# Patient Record
Sex: Male | Born: 1950
Health system: Southern US, Community
[De-identification: ages and names within clinical notes are randomized; demographics above are authoritative.]

## PROBLEM LIST (undated history)

## (undated) DIAGNOSIS — I10 Essential (primary) hypertension: Secondary | ICD-10-CM

---

## 2007-07-25 ENCOUNTER — Emergency Department (HOSPITAL_COMMUNITY): Admission: EM | Admit: 2007-07-25 | Discharge: 2007-07-25 | Payer: Self-pay | Admitting: Emergency Medicine

## 2007-07-28 ENCOUNTER — Ambulatory Visit (HOSPITAL_COMMUNITY): Admission: RE | Admit: 2007-07-28 | Discharge: 2007-07-29 | Payer: Self-pay | Admitting: Orthopedic Surgery

## 2009-06-25 IMAGING — CR DG CHEST 2V
2 series · 2 of 2 positions shown · non-contrast
Comparison: None

CLINICAL DATA: Preop left wrist fracture

CHEST - 2 VIEW

[view not recorded (1 of 2)]
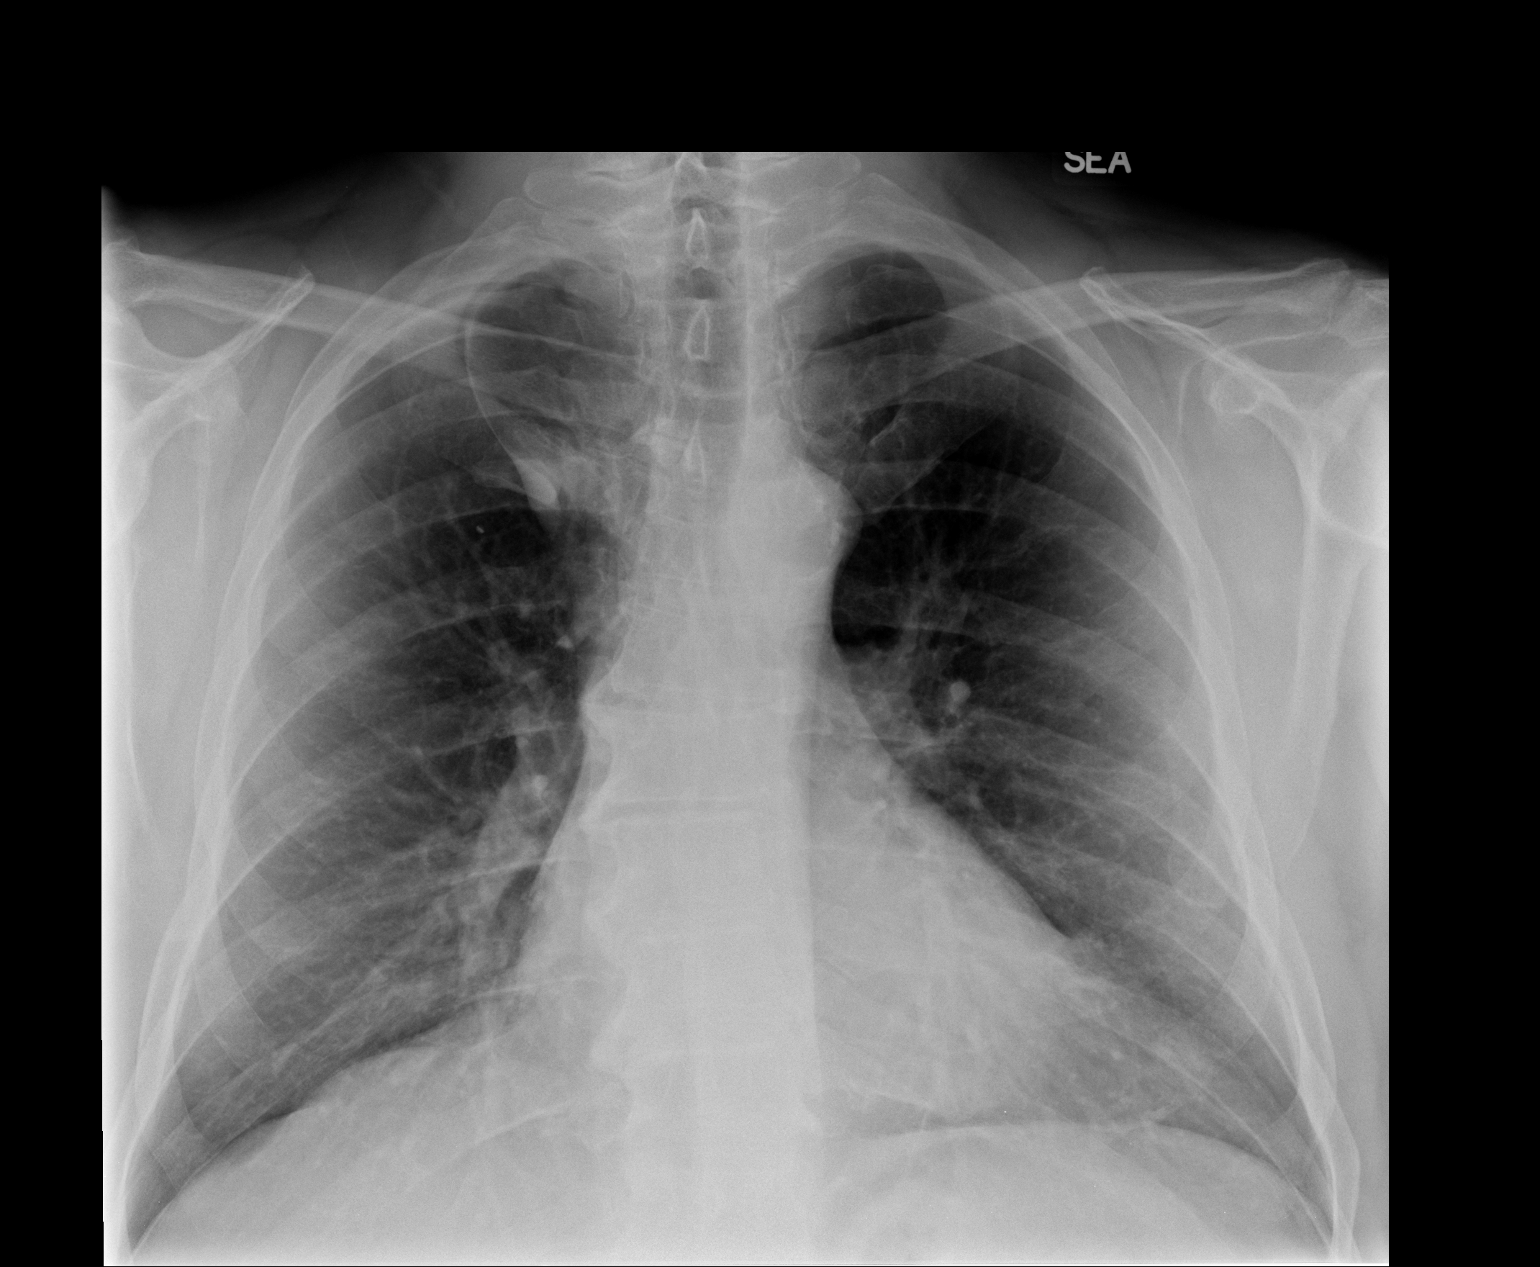

[view not recorded (2 of 2)]
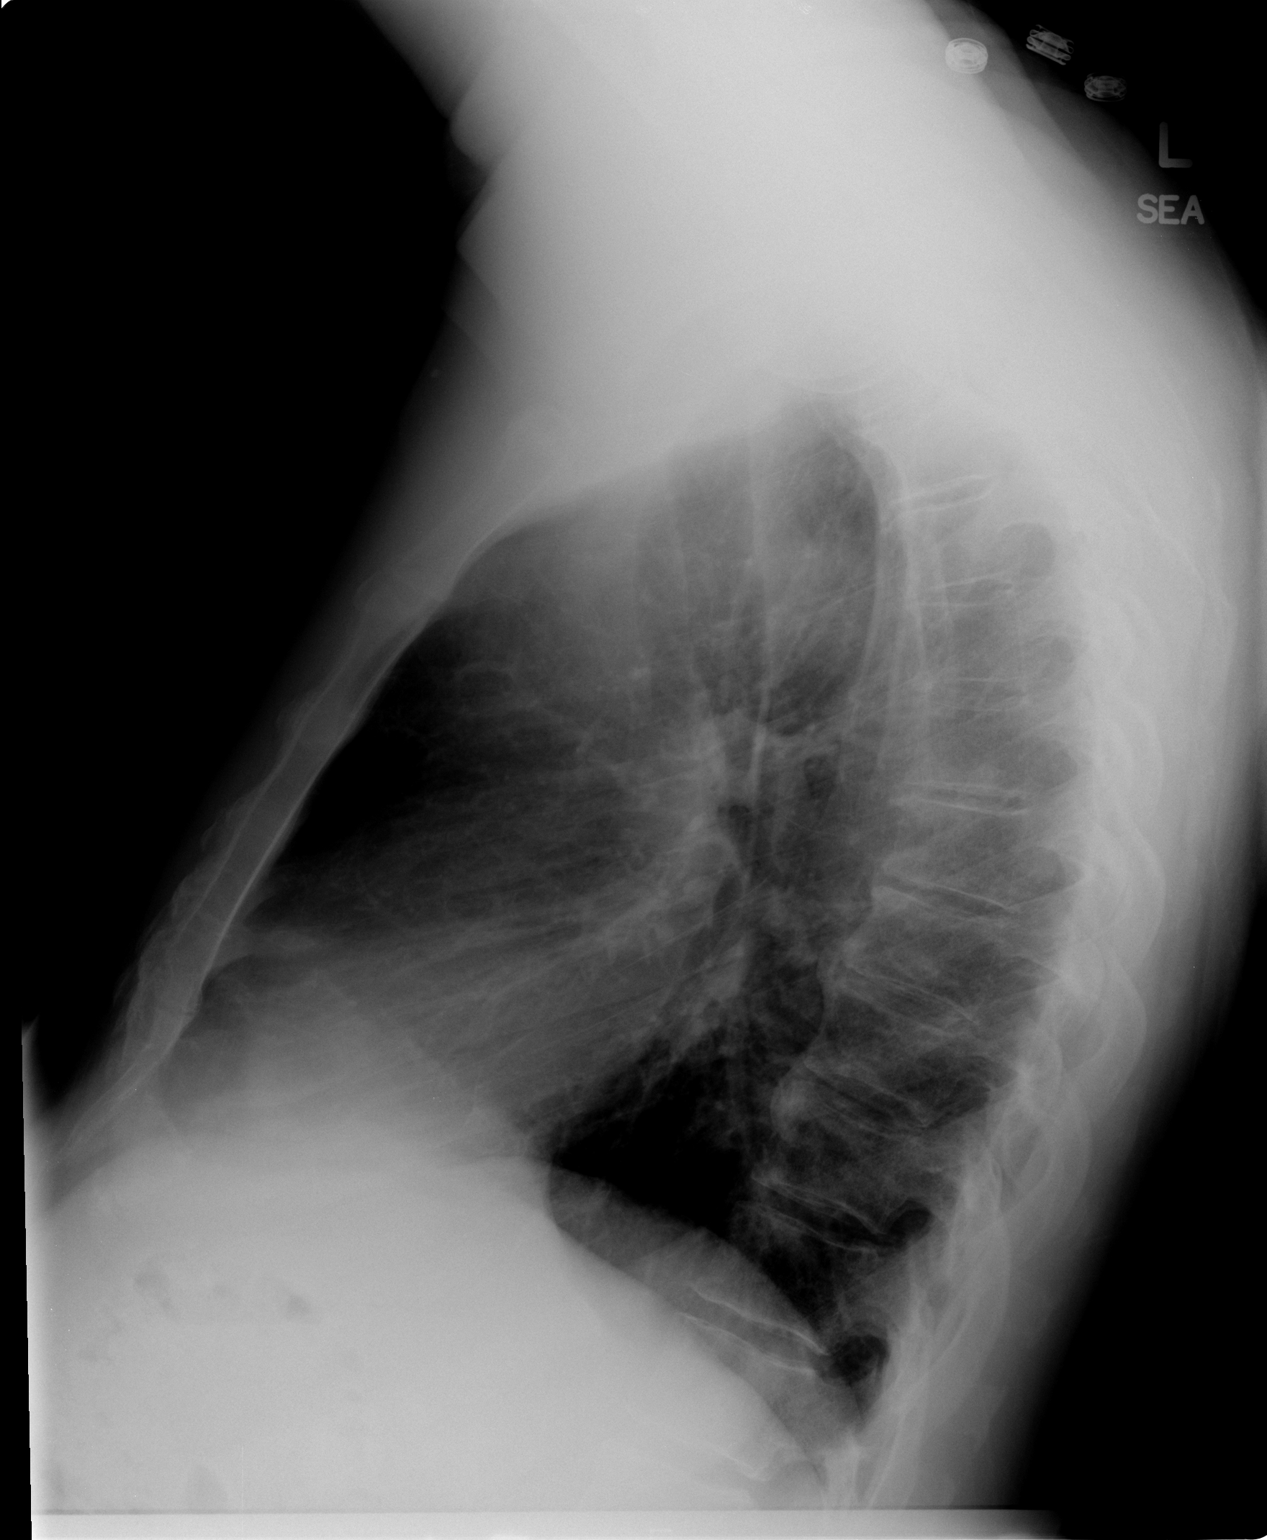

[2 of 2 positions shown; findings below may reference images not displayed]

FINDINGS: Heart and mediastinal contours are within normal limits.
There is peribronchial thickening.  No confluent opacities or
effusions.  Degenerative changes in the thoracic spine.
IMPRESSION: Mild bronchitic changes.

## 2010-07-13 NOTE — Op Note (Signed)
[name]                ACCOUNT NO.:  [id number]   MEDICAL RECORD NO.:  [id number]          PATIENT TYPE:  EMS   LOCATION:  MAJO                         FACILITY:  [facility]   PHYSICIAN:  Dionne Ano. Gramig III, M.D.DATE OF BIRTH:  June 26, 1950   DATE OF PROCEDURE:  07/28/2007  DATE OF DISCHARGE:  07/25/2007                               OPERATIVE REPORT   PREOPERATIVE DIAGNOSIS:  Comminuted complex left distal radius fracture  greater than 3 parts.   POSTOPERATIVE DIAGNOSIS:  Comminuted complex left distal radius fracture  greater than 3 parts.   PROCEDURE:  1. Open reduction internal fixation of the left wrist fracture with      DVR plate and screw construct .  2. Brachioradialis tenotomy.  3. Stress radiography.   SURGEON:  Dionne Ano. Gramig M.D.   ASSISTANT:  Karie Chimera, P.A.-C.   COMPLICATIONS:  None.   ANESTHESIA:  General.   TOURNIQUET TIME:  Less than an hour.   DRAINS:  One.   BLOOD LOSS:  Minimal.   INDICATIONS FOR PROCEDURE:  This patient is a pleasant 60 year old male  presents with a shortened distal radius.  I have discussed with him the  risks and benefits of surgery including risk of infection, bleeding  anesthesia, damage to normal structures and failure of surgery to  accomplish, its intended goals of relieving symptoms and restoring  function, but at this time, he desires to proceed.  All questions have  been encouraged and answered preoperatively.   OPERATIVE PROCEDURE:  Placed him on Omnicef, anesthesia.  Preoperative  antibiotics were given.  Arm was marked, time-out called.  All questions  were encouraged and answered.  He was taken to operative suite and  underwent general anesthetic.  Following this, prepped and draped in a  sterile fashion with the left upper extremity.  Once this done, smooth a  volar radial approach to the radius was performed.  Brachioradialis  tenotomy was performed with the peel technique.  Following this, the  patient had the fracture reduced and a DVR plate and screw construct was  placed.  Should note that the dissection incorporated FCR release about  it, so a palmar and volar constraints.  The carpal canal contents were  swept out of harm's way ulnarly and the pronator was incised in a L-  shaped fashion in the radial to ulnar direction.  Following plate  application and brachioradialis released, the patient had x-rays taken,  AP lateral and oblique, as well as intraoperative fluoroscopy  demonstrating good alignment.  He had excellently restored radial height  inclination and volar tilt.  He had a notable STT degenerative changes,  which we reviewed preoperatively, but no carpal bone fracture and no  instability patterns.   The patient had the tourniquet deflated at less than an hour.  Pronator  was closed with Vicryl, TLS drain was placed, subcu closed with Vicryl,  skin edge with Prolene, marked Sensorcaine 0.25% without epinephrine was  placed for postop analgesia, and the patient was taken to the recovery  room after splint was placed.  He will be admitted for  IV antibiotics,  general postop observation and other measures and we will notify should  any problems occur.  We will monitor him in the hospital at the 10-12  days postop and mark will remove sutures and apply Steri-Strips.  X-rays  will be taken and a cast will be applied at 3-1/2 to 4 weeks postop.  Begin range of motion with interval splint use and strengthening and  therapy at 8 weeks postop.  He will be at work until his condition  allows light duties.  He has tolerated the procedure well with no  complicating features.  All sponge, needle, and instrument counts were  reported as correct.      Dionne Ano. Everlene Other, M.D.     Nash Mantis  D:  07/28/2007  T:  07/28/2007  Job:  [id number]

## 2010-11-24 LAB — CBC
MCHC: 34.1
MCV: 84.3
Platelets: 176
RDW: 12.9

## 2010-11-24 LAB — BASIC METABOLIC PANEL
BUN: 17
CO2: 28
Calcium: 9.2
Chloride: 104
Creatinine, Ser: 0.8

## 2013-05-14 ENCOUNTER — Emergency Department: Payer: Self-pay | Admitting: Emergency Medicine

## 2017-06-14 DIAGNOSIS — E669 Obesity, unspecified: Secondary | ICD-10-CM | POA: Diagnosis not present

## 2017-06-14 DIAGNOSIS — Z7984 Long term (current) use of oral hypoglycemic drugs: Secondary | ICD-10-CM | POA: Diagnosis not present

## 2017-06-14 DIAGNOSIS — I10 Essential (primary) hypertension: Secondary | ICD-10-CM | POA: Diagnosis not present

## 2017-06-14 DIAGNOSIS — Z6831 Body mass index (BMI) 31.0-31.9, adult: Secondary | ICD-10-CM | POA: Diagnosis not present

## 2017-06-14 DIAGNOSIS — N401 Enlarged prostate with lower urinary tract symptoms: Secondary | ICD-10-CM | POA: Diagnosis not present

## 2017-06-14 DIAGNOSIS — E1165 Type 2 diabetes mellitus with hyperglycemia: Secondary | ICD-10-CM | POA: Diagnosis not present

## 2017-10-14 ENCOUNTER — Other Ambulatory Visit: Payer: Self-pay

## 2017-10-14 ENCOUNTER — Ambulatory Visit (HOSPITAL_COMMUNITY)
Admission: EM | Admit: 2017-10-14 | Discharge: 2017-10-14 | Disposition: A | Discharge status: Home / Self Care | Payer: Medicare HMO | Attending: Family Medicine | Admitting: Family Medicine

## 2017-10-14 ENCOUNTER — Encounter (HOSPITAL_COMMUNITY): Payer: Self-pay

## 2017-10-14 DIAGNOSIS — B880 Other acariasis: Secondary | ICD-10-CM

## 2017-10-14 HISTORY — DX: Essential (primary) hypertension: I10

## 2017-10-14 NOTE — ED Triage Notes (Signed)
Pt presents today with rash all over body that he noticed on Thursday. States that it does itch. Does state that he walked some land on Wednesday. Does not know of any allergies to anything.

## 2017-10-14 NOTE — ED Provider Notes (Signed)
  [facility]   [id number] 10/14/17 Arrival Time: 1010  ASSESSMENT & PLAN:  1. Chigger bites    Prefers OTC Claritin if needed. Will follow up with PCP or here if worsening or failing to improve as anticipated. Reviewed expectations re: course of current medical issues. Questions answered. Outlined signs and symptoms indicating need for more acute intervention. Patient verbalized understanding. After Visit Summary given.   SUBJECTIVE:  Dorette Gratehomas W Azzara is a 67 y.o. male who presents with a skin complaint.   Location: waist down mostly but some on trunk Onset: abrupt Duration: a few days Pruritic? Yes Painful? No Progression: stable  Drainage? No  Known trigger? questions insect bites; out in woods before he noticed New soaps/lotions/topicals/detergents? No Contacts with similar? No Recent travel? No  Other associated symptoms: none Therapies tried thus far: none Denies fever. No specific aggravating or alleviating factors reported.  ROS: As per HPI.  OBJECTIVE: Vitals:   10/14/17 1030  BP: (!) 153/79  Pulse: (!) 54  Resp: 16  Temp: 97.7 F (36.5 C)  TempSrc: Oral  SpO2: 96%    General appearance: alert; no distress Lungs: clear to auscultation bilaterally Heart: regular rate and rhythm Extremities: no edema Skin: warm and dry; scattered red indurations over body; mostly waist down; consistent with chigger bites; no signs of infection Psychological: alert and cooperative; normal mood and affect  No Known Allergies  No past medical history on file. Social History   Socioeconomic History  . Marital status: Married    Spouse name: Not on file  . Number of children: Not on file  . Years of education: Not on file  . Highest education level: Not on file  Occupational History  . Not on file  Social Needs  . Financial resource strain: Not on file  . Food insecurity:    Worry: Not on file    Inability: Not on file  . Transportation needs:   Medical: Not on file    Non-medical: Not on file  Tobacco Use  . Smoking status: Not on file  Substance and Sexual Activity  . Alcohol use: Not on file  . Drug use: Not on file  . Sexual activity: Not on file  Lifestyle  . Physical activity:    Days per week: Not on file    Minutes per session: Not on file  . Stress: Not on file  Relationships  . Social connections:    Talks on phone: Not on file    Gets together: Not on file    Attends religious service: Not on file    Active member of club or organization: Not on file    Attends meetings of clubs or organizations: Not on file    Relationship status: Not on file  . Intimate partner violence:    Fear of current or ex partner: Not on file    Emotionally abused: Not on file    Physically abused: Not on file    Forced sexual activity: Not on file  Other Topics Concern  . Not on file  Social History Narrative  . Not on file      Mardella LaymanHagler, Nichlas Pitera, MD 10/14/17 1105

## 2017-11-08 DIAGNOSIS — E119 Type 2 diabetes mellitus without complications: Secondary | ICD-10-CM | POA: Diagnosis not present

## 2017-11-08 DIAGNOSIS — H5203 Hypermetropia, bilateral: Secondary | ICD-10-CM | POA: Diagnosis not present

## 2017-11-08 DIAGNOSIS — H524 Presbyopia: Secondary | ICD-10-CM | POA: Diagnosis not present

## 2017-11-09 DIAGNOSIS — Z23 Encounter for immunization: Secondary | ICD-10-CM | POA: Diagnosis not present

## 2017-12-11 DIAGNOSIS — R69 Illness, unspecified: Secondary | ICD-10-CM | POA: Diagnosis not present

## 2017-12-13 DIAGNOSIS — E1165 Type 2 diabetes mellitus with hyperglycemia: Secondary | ICD-10-CM | POA: Diagnosis not present

## 2017-12-13 DIAGNOSIS — E782 Mixed hyperlipidemia: Secondary | ICD-10-CM | POA: Diagnosis not present

## 2017-12-13 DIAGNOSIS — N401 Enlarged prostate with lower urinary tract symptoms: Secondary | ICD-10-CM | POA: Diagnosis not present

## 2017-12-13 DIAGNOSIS — I10 Essential (primary) hypertension: Secondary | ICD-10-CM | POA: Diagnosis not present

## 2017-12-22 DIAGNOSIS — R69 Illness, unspecified: Secondary | ICD-10-CM | POA: Diagnosis not present

## 2017-12-27 DIAGNOSIS — R69 Illness, unspecified: Secondary | ICD-10-CM | POA: Diagnosis not present

## 2017-12-28 DIAGNOSIS — Z79899 Other long term (current) drug therapy: Secondary | ICD-10-CM | POA: Diagnosis not present

## 2017-12-28 DIAGNOSIS — E669 Obesity, unspecified: Secondary | ICD-10-CM | POA: Diagnosis not present

## 2017-12-28 DIAGNOSIS — Z125 Encounter for screening for malignant neoplasm of prostate: Secondary | ICD-10-CM | POA: Diagnosis not present

## 2017-12-28 DIAGNOSIS — I10 Essential (primary) hypertension: Secondary | ICD-10-CM | POA: Diagnosis not present

## 2017-12-28 DIAGNOSIS — N401 Enlarged prostate with lower urinary tract symptoms: Secondary | ICD-10-CM | POA: Diagnosis not present

## 2017-12-28 DIAGNOSIS — Z0001 Encounter for general adult medical examination with abnormal findings: Secondary | ICD-10-CM | POA: Diagnosis not present

## 2017-12-28 DIAGNOSIS — E782 Mixed hyperlipidemia: Secondary | ICD-10-CM | POA: Diagnosis not present

## 2017-12-28 DIAGNOSIS — E1165 Type 2 diabetes mellitus with hyperglycemia: Secondary | ICD-10-CM | POA: Diagnosis not present

## 2017-12-28 DIAGNOSIS — Z1389 Encounter for screening for other disorder: Secondary | ICD-10-CM | POA: Diagnosis not present

## 2019-04-25 ENCOUNTER — Ambulatory Visit: Payer: Medicare Other | Attending: Internal Medicine

## 2019-04-25 DIAGNOSIS — Z23 Encounter for immunization: Secondary | ICD-10-CM | POA: Insufficient documentation

## 2019-04-25 NOTE — Progress Notes (Signed)
   Covid-19 Vaccination [facility]  Name:  [name]    MRN: [id number] DOB: Mar 05, 1950  04/25/2019  [name] was observed post Covid-19 immunization for 15 minutes without incidence. He was provided with Vaccine Information Sheet and instruction to access the V-Safe system.   [name] was instructed to call 911 with any severe reactions post vaccine: Marland Kitchen Difficulty breathing  . Swelling of your face and throat  . A fast heartbeat  . A bad rash all over your body  . Dizziness and weakness    Immunizations Administered    Name Date Dose VIS Date Route   Pfizer COVID-19 Vaccine 04/25/2019  9:17 AM 0.3 mL 02/08/2019 Intramuscular   Manufacturer: [employer], [employer]   Lot: [id number]   NDC: [address]-8

## 2019-05-21 ENCOUNTER — Ambulatory Visit: Payer: Medicare Other | Attending: Internal Medicine

## 2019-05-21 DIAGNOSIS — Z23 Encounter for immunization: Secondary | ICD-10-CM

## 2019-05-21 NOTE — Progress Notes (Signed)
   Covid-19 Vaccination Clinic  Name:  [name]    MRN: [id number] DOB: 10/25/1950  05/21/2019  Mr. [name] was observed post Covid-19 immunization for 15 minutes without incident. He was provided with Vaccine Information Sheet and instruction to access the V-Safe system.   Mr. [name] was instructed to call 911 with any severe reactions post vaccine: Marland Kitchen Difficulty breathing  . Swelling of face and throat  . A fast heartbeat  . A bad rash all over body  . Dizziness and weakness   Immunizations Administered    Name Date Dose VIS Date Route   Pfizer COVID-19 Vaccine 05/21/2019  9:02 AM 0.3 mL 02/08/2019 Intramuscular   Manufacturer: [employer], [employer]   Lot: [id number]   NDC: [address]-4

## 2020-03-30 DIAGNOSIS — E1165 Type 2 diabetes mellitus with hyperglycemia: Secondary | ICD-10-CM | POA: Diagnosis not present

## 2020-04-02 DIAGNOSIS — I10 Essential (primary) hypertension: Secondary | ICD-10-CM | POA: Diagnosis not present

## 2020-04-02 DIAGNOSIS — E1165 Type 2 diabetes mellitus with hyperglycemia: Secondary | ICD-10-CM | POA: Diagnosis not present

## 2020-04-02 DIAGNOSIS — E782 Mixed hyperlipidemia: Secondary | ICD-10-CM | POA: Diagnosis not present

## 2020-04-27 DIAGNOSIS — E1165 Type 2 diabetes mellitus with hyperglycemia: Secondary | ICD-10-CM | POA: Diagnosis not present

## 2020-05-28 DIAGNOSIS — E1165 Type 2 diabetes mellitus with hyperglycemia: Secondary | ICD-10-CM | POA: Diagnosis not present

## 2020-06-16 DIAGNOSIS — E782 Mixed hyperlipidemia: Secondary | ICD-10-CM | POA: Diagnosis not present

## 2020-06-16 DIAGNOSIS — I10 Essential (primary) hypertension: Secondary | ICD-10-CM | POA: Diagnosis not present

## 2020-06-16 DIAGNOSIS — E1165 Type 2 diabetes mellitus with hyperglycemia: Secondary | ICD-10-CM | POA: Diagnosis not present

## 2020-06-26 DIAGNOSIS — E1165 Type 2 diabetes mellitus with hyperglycemia: Secondary | ICD-10-CM | POA: Diagnosis not present

## 2020-07-28 DIAGNOSIS — E1165 Type 2 diabetes mellitus with hyperglycemia: Secondary | ICD-10-CM | POA: Diagnosis not present

## 2020-07-31 DIAGNOSIS — E782 Mixed hyperlipidemia: Secondary | ICD-10-CM | POA: Diagnosis not present

## 2020-07-31 DIAGNOSIS — E1165 Type 2 diabetes mellitus with hyperglycemia: Secondary | ICD-10-CM | POA: Diagnosis not present

## 2020-07-31 DIAGNOSIS — I10 Essential (primary) hypertension: Secondary | ICD-10-CM | POA: Diagnosis not present

## 2020-08-05 DIAGNOSIS — E782 Mixed hyperlipidemia: Secondary | ICD-10-CM | POA: Diagnosis not present

## 2020-08-05 DIAGNOSIS — I10 Essential (primary) hypertension: Secondary | ICD-10-CM | POA: Diagnosis not present

## 2020-08-05 DIAGNOSIS — Z7984 Long term (current) use of oral hypoglycemic drugs: Secondary | ICD-10-CM | POA: Diagnosis not present

## 2020-08-05 DIAGNOSIS — E1165 Type 2 diabetes mellitus with hyperglycemia: Secondary | ICD-10-CM | POA: Diagnosis not present

## 2020-08-27 DIAGNOSIS — E1165 Type 2 diabetes mellitus with hyperglycemia: Secondary | ICD-10-CM | POA: Diagnosis not present

## 2020-09-08 DIAGNOSIS — I10 Essential (primary) hypertension: Secondary | ICD-10-CM | POA: Diagnosis not present

## 2020-09-08 DIAGNOSIS — E1165 Type 2 diabetes mellitus with hyperglycemia: Secondary | ICD-10-CM | POA: Diagnosis not present

## 2020-09-08 DIAGNOSIS — E782 Mixed hyperlipidemia: Secondary | ICD-10-CM | POA: Diagnosis not present

## 2020-09-24 DIAGNOSIS — E1165 Type 2 diabetes mellitus with hyperglycemia: Secondary | ICD-10-CM | POA: Diagnosis not present

## 2020-10-04 DIAGNOSIS — U071 COVID-19: Secondary | ICD-10-CM | POA: Diagnosis not present

## 2020-10-04 DIAGNOSIS — R519 Headache, unspecified: Secondary | ICD-10-CM | POA: Diagnosis not present

## 2020-10-04 DIAGNOSIS — J029 Acute pharyngitis, unspecified: Secondary | ICD-10-CM | POA: Diagnosis not present

## 2020-10-04 DIAGNOSIS — R059 Cough, unspecified: Secondary | ICD-10-CM | POA: Diagnosis not present

## 2020-11-05 DIAGNOSIS — E782 Mixed hyperlipidemia: Secondary | ICD-10-CM | POA: Diagnosis not present

## 2020-11-05 DIAGNOSIS — I10 Essential (primary) hypertension: Secondary | ICD-10-CM | POA: Diagnosis not present

## 2020-11-05 DIAGNOSIS — E1165 Type 2 diabetes mellitus with hyperglycemia: Secondary | ICD-10-CM | POA: Diagnosis not present

## 2020-11-18 DIAGNOSIS — H524 Presbyopia: Secondary | ICD-10-CM | POA: Diagnosis not present

## 2020-11-18 DIAGNOSIS — H43813 Vitreous degeneration, bilateral: Secondary | ICD-10-CM | POA: Diagnosis not present

## 2020-11-18 DIAGNOSIS — E119 Type 2 diabetes mellitus without complications: Secondary | ICD-10-CM | POA: Diagnosis not present

## 2020-11-18 DIAGNOSIS — H2513 Age-related nuclear cataract, bilateral: Secondary | ICD-10-CM | POA: Diagnosis not present

## 2020-12-04 DIAGNOSIS — R3915 Urgency of urination: Secondary | ICD-10-CM | POA: Diagnosis not present

## 2020-12-28 DIAGNOSIS — E1165 Type 2 diabetes mellitus with hyperglycemia: Secondary | ICD-10-CM | POA: Diagnosis not present

## 2021-02-04 DIAGNOSIS — Z1211 Encounter for screening for malignant neoplasm of colon: Secondary | ICD-10-CM | POA: Diagnosis not present

## 2021-02-04 DIAGNOSIS — E782 Mixed hyperlipidemia: Secondary | ICD-10-CM | POA: Diagnosis not present

## 2021-02-04 DIAGNOSIS — Z1389 Encounter for screening for other disorder: Secondary | ICD-10-CM | POA: Diagnosis not present

## 2021-02-04 DIAGNOSIS — Z7984 Long term (current) use of oral hypoglycemic drugs: Secondary | ICD-10-CM | POA: Diagnosis not present

## 2021-02-04 DIAGNOSIS — Z0000 Encounter for general adult medical examination without abnormal findings: Secondary | ICD-10-CM | POA: Diagnosis not present

## 2021-02-04 DIAGNOSIS — E1165 Type 2 diabetes mellitus with hyperglycemia: Secondary | ICD-10-CM | POA: Diagnosis not present

## 2021-02-04 DIAGNOSIS — E559 Vitamin D deficiency, unspecified: Secondary | ICD-10-CM | POA: Diagnosis not present

## 2021-02-04 DIAGNOSIS — Z23 Encounter for immunization: Secondary | ICD-10-CM | POA: Diagnosis not present

## 2021-02-04 DIAGNOSIS — I10 Essential (primary) hypertension: Secondary | ICD-10-CM | POA: Diagnosis not present

## 2021-02-26 DIAGNOSIS — E1165 Type 2 diabetes mellitus with hyperglycemia: Secondary | ICD-10-CM | POA: Diagnosis not present

## 2021-03-04 DIAGNOSIS — E1165 Type 2 diabetes mellitus with hyperglycemia: Secondary | ICD-10-CM | POA: Diagnosis not present

## 2021-03-04 DIAGNOSIS — I10 Essential (primary) hypertension: Secondary | ICD-10-CM | POA: Diagnosis not present

## 2021-03-08 DIAGNOSIS — I10 Essential (primary) hypertension: Secondary | ICD-10-CM | POA: Diagnosis not present

## 2021-03-08 DIAGNOSIS — E782 Mixed hyperlipidemia: Secondary | ICD-10-CM | POA: Diagnosis not present

## 2021-03-08 DIAGNOSIS — E1165 Type 2 diabetes mellitus with hyperglycemia: Secondary | ICD-10-CM | POA: Diagnosis not present

## 2021-03-23 DIAGNOSIS — I10 Essential (primary) hypertension: Secondary | ICD-10-CM | POA: Diagnosis not present

## 2021-03-23 DIAGNOSIS — E1165 Type 2 diabetes mellitus with hyperglycemia: Secondary | ICD-10-CM | POA: Diagnosis not present

## 2021-03-30 DIAGNOSIS — E1165 Type 2 diabetes mellitus with hyperglycemia: Secondary | ICD-10-CM | POA: Diagnosis not present

## 2021-04-27 DIAGNOSIS — E1165 Type 2 diabetes mellitus with hyperglycemia: Secondary | ICD-10-CM | POA: Diagnosis not present

## 2021-05-06 DIAGNOSIS — I10 Essential (primary) hypertension: Secondary | ICD-10-CM | POA: Diagnosis not present

## 2021-05-06 DIAGNOSIS — E1165 Type 2 diabetes mellitus with hyperglycemia: Secondary | ICD-10-CM | POA: Diagnosis not present

## 2021-05-06 DIAGNOSIS — E782 Mixed hyperlipidemia: Secondary | ICD-10-CM | POA: Diagnosis not present

## 2021-05-28 DIAGNOSIS — E1165 Type 2 diabetes mellitus with hyperglycemia: Secondary | ICD-10-CM | POA: Diagnosis not present

## 2021-06-24 DIAGNOSIS — I10 Essential (primary) hypertension: Secondary | ICD-10-CM | POA: Diagnosis not present

## 2021-06-24 DIAGNOSIS — E1165 Type 2 diabetes mellitus with hyperglycemia: Secondary | ICD-10-CM | POA: Diagnosis not present

## 2021-06-27 DIAGNOSIS — E1165 Type 2 diabetes mellitus with hyperglycemia: Secondary | ICD-10-CM | POA: Diagnosis not present

## 2021-07-14 DIAGNOSIS — I10 Essential (primary) hypertension: Secondary | ICD-10-CM | POA: Diagnosis not present

## 2021-07-14 DIAGNOSIS — E1165 Type 2 diabetes mellitus with hyperglycemia: Secondary | ICD-10-CM | POA: Diagnosis not present

## 2021-07-28 DIAGNOSIS — E1165 Type 2 diabetes mellitus with hyperglycemia: Secondary | ICD-10-CM | POA: Diagnosis not present

## 2021-09-23 DIAGNOSIS — E782 Mixed hyperlipidemia: Secondary | ICD-10-CM | POA: Diagnosis not present

## 2021-09-23 DIAGNOSIS — I10 Essential (primary) hypertension: Secondary | ICD-10-CM | POA: Diagnosis not present

## 2021-09-23 DIAGNOSIS — E1165 Type 2 diabetes mellitus with hyperglycemia: Secondary | ICD-10-CM | POA: Diagnosis not present

## 2021-11-23 DIAGNOSIS — E119 Type 2 diabetes mellitus without complications: Secondary | ICD-10-CM | POA: Diagnosis not present

## 2021-11-23 DIAGNOSIS — H25013 Cortical age-related cataract, bilateral: Secondary | ICD-10-CM | POA: Diagnosis not present

## 2021-11-23 DIAGNOSIS — H524 Presbyopia: Secondary | ICD-10-CM | POA: Diagnosis not present

## 2021-11-23 DIAGNOSIS — H5203 Hypermetropia, bilateral: Secondary | ICD-10-CM | POA: Diagnosis not present

## 2021-11-23 DIAGNOSIS — H43813 Vitreous degeneration, bilateral: Secondary | ICD-10-CM | POA: Diagnosis not present

## 2021-11-23 DIAGNOSIS — H2513 Age-related nuclear cataract, bilateral: Secondary | ICD-10-CM | POA: Diagnosis not present

## 2021-12-24 DIAGNOSIS — R3915 Urgency of urination: Secondary | ICD-10-CM | POA: Diagnosis not present

## 2022-02-04 DIAGNOSIS — E1165 Type 2 diabetes mellitus with hyperglycemia: Secondary | ICD-10-CM | POA: Diagnosis not present

## 2022-03-30 DIAGNOSIS — E669 Obesity, unspecified: Secondary | ICD-10-CM | POA: Diagnosis not present

## 2022-03-30 DIAGNOSIS — Z23 Encounter for immunization: Secondary | ICD-10-CM | POA: Diagnosis not present

## 2022-03-30 DIAGNOSIS — N401 Enlarged prostate with lower urinary tract symptoms: Secondary | ICD-10-CM | POA: Diagnosis not present

## 2022-03-30 DIAGNOSIS — I10 Essential (primary) hypertension: Secondary | ICD-10-CM | POA: Diagnosis not present

## 2022-03-30 DIAGNOSIS — Z125 Encounter for screening for malignant neoplasm of prostate: Secondary | ICD-10-CM | POA: Diagnosis not present

## 2022-03-30 DIAGNOSIS — E1165 Type 2 diabetes mellitus with hyperglycemia: Secondary | ICD-10-CM | POA: Diagnosis not present

## 2022-03-30 DIAGNOSIS — Z1331 Encounter for screening for depression: Secondary | ICD-10-CM | POA: Diagnosis not present

## 2022-03-30 DIAGNOSIS — E782 Mixed hyperlipidemia: Secondary | ICD-10-CM | POA: Diagnosis not present

## 2022-03-30 DIAGNOSIS — E559 Vitamin D deficiency, unspecified: Secondary | ICD-10-CM | POA: Diagnosis not present

## 2022-03-30 DIAGNOSIS — Z1211 Encounter for screening for malignant neoplasm of colon: Secondary | ICD-10-CM | POA: Diagnosis not present

## 2022-03-30 DIAGNOSIS — Z0000 Encounter for general adult medical examination without abnormal findings: Secondary | ICD-10-CM | POA: Diagnosis not present

## 2022-05-13 DIAGNOSIS — Z86010 Personal history of colonic polyps: Secondary | ICD-10-CM | POA: Diagnosis not present

## 2022-05-13 DIAGNOSIS — K648 Other hemorrhoids: Secondary | ICD-10-CM | POA: Diagnosis not present

## 2022-05-13 DIAGNOSIS — D128 Benign neoplasm of rectum: Secondary | ICD-10-CM | POA: Diagnosis not present

## 2022-05-13 DIAGNOSIS — Z09 Encounter for follow-up examination after completed treatment for conditions other than malignant neoplasm: Secondary | ICD-10-CM | POA: Diagnosis not present

## 2022-05-13 DIAGNOSIS — D122 Benign neoplasm of ascending colon: Secondary | ICD-10-CM | POA: Diagnosis not present

## 2022-05-17 DIAGNOSIS — D122 Benign neoplasm of ascending colon: Secondary | ICD-10-CM | POA: Diagnosis not present

## 2022-05-20 DIAGNOSIS — E782 Mixed hyperlipidemia: Secondary | ICD-10-CM | POA: Diagnosis not present

## 2022-05-20 DIAGNOSIS — E1165 Type 2 diabetes mellitus with hyperglycemia: Secondary | ICD-10-CM | POA: Diagnosis not present

## 2022-05-20 DIAGNOSIS — I10 Essential (primary) hypertension: Secondary | ICD-10-CM | POA: Diagnosis not present

## 2022-05-20 DIAGNOSIS — E559 Vitamin D deficiency, unspecified: Secondary | ICD-10-CM | POA: Diagnosis not present

## 2022-06-10 DIAGNOSIS — E6609 Other obesity due to excess calories: Secondary | ICD-10-CM | POA: Diagnosis not present

## 2022-06-10 DIAGNOSIS — I10 Essential (primary) hypertension: Secondary | ICD-10-CM | POA: Diagnosis not present

## 2022-06-10 DIAGNOSIS — Z6831 Body mass index (BMI) 31.0-31.9, adult: Secondary | ICD-10-CM | POA: Diagnosis not present

## 2022-09-30 DIAGNOSIS — I10 Essential (primary) hypertension: Secondary | ICD-10-CM | POA: Diagnosis not present

## 2022-09-30 DIAGNOSIS — E1165 Type 2 diabetes mellitus with hyperglycemia: Secondary | ICD-10-CM | POA: Diagnosis not present

## 2022-09-30 DIAGNOSIS — Z79899 Other long term (current) drug therapy: Secondary | ICD-10-CM | POA: Diagnosis not present

## 2022-11-28 DIAGNOSIS — H5203 Hypermetropia, bilateral: Secondary | ICD-10-CM | POA: Diagnosis not present

## 2022-11-28 DIAGNOSIS — H25813 Combined forms of age-related cataract, bilateral: Secondary | ICD-10-CM | POA: Diagnosis not present

## 2022-11-28 DIAGNOSIS — E119 Type 2 diabetes mellitus without complications: Secondary | ICD-10-CM | POA: Diagnosis not present

## 2023-08-28 DIAGNOSIS — N401 Enlarged prostate with lower urinary tract symptoms: Secondary | ICD-10-CM | POA: Diagnosis not present

## 2023-08-28 DIAGNOSIS — E6609 Other obesity due to excess calories: Secondary | ICD-10-CM | POA: Diagnosis not present

## 2023-08-28 DIAGNOSIS — I10 Essential (primary) hypertension: Secondary | ICD-10-CM | POA: Diagnosis not present

## 2023-08-28 DIAGNOSIS — E782 Mixed hyperlipidemia: Secondary | ICD-10-CM | POA: Diagnosis not present

## 2023-09-28 DIAGNOSIS — N401 Enlarged prostate with lower urinary tract symptoms: Secondary | ICD-10-CM | POA: Diagnosis not present

## 2023-09-28 DIAGNOSIS — I10 Essential (primary) hypertension: Secondary | ICD-10-CM | POA: Diagnosis not present

## 2023-09-28 DIAGNOSIS — E782 Mixed hyperlipidemia: Secondary | ICD-10-CM | POA: Diagnosis not present

## 2023-09-28 DIAGNOSIS — E6609 Other obesity due to excess calories: Secondary | ICD-10-CM | POA: Diagnosis not present

## 2023-10-04 DIAGNOSIS — E6609 Other obesity due to excess calories: Secondary | ICD-10-CM | POA: Diagnosis not present

## 2023-10-04 DIAGNOSIS — E1165 Type 2 diabetes mellitus with hyperglycemia: Secondary | ICD-10-CM | POA: Diagnosis not present

## 2023-10-04 DIAGNOSIS — E559 Vitamin D deficiency, unspecified: Secondary | ICD-10-CM | POA: Diagnosis not present

## 2023-10-04 DIAGNOSIS — N401 Enlarged prostate with lower urinary tract symptoms: Secondary | ICD-10-CM | POA: Diagnosis not present

## 2023-10-04 DIAGNOSIS — Z125 Encounter for screening for malignant neoplasm of prostate: Secondary | ICD-10-CM | POA: Diagnosis not present

## 2023-10-04 DIAGNOSIS — I10 Essential (primary) hypertension: Secondary | ICD-10-CM | POA: Diagnosis not present

## 2023-10-04 DIAGNOSIS — Z0000 Encounter for general adult medical examination without abnormal findings: Secondary | ICD-10-CM | POA: Diagnosis not present

## 2023-10-04 DIAGNOSIS — E782 Mixed hyperlipidemia: Secondary | ICD-10-CM | POA: Diagnosis not present

## 2023-10-04 DIAGNOSIS — Z1211 Encounter for screening for malignant neoplasm of colon: Secondary | ICD-10-CM | POA: Diagnosis not present

## 2023-10-29 DIAGNOSIS — E6609 Other obesity due to excess calories: Secondary | ICD-10-CM | POA: Diagnosis not present

## 2023-10-29 DIAGNOSIS — I10 Essential (primary) hypertension: Secondary | ICD-10-CM | POA: Diagnosis not present

## 2023-10-29 DIAGNOSIS — N401 Enlarged prostate with lower urinary tract symptoms: Secondary | ICD-10-CM | POA: Diagnosis not present

## 2023-10-29 DIAGNOSIS — E782 Mixed hyperlipidemia: Secondary | ICD-10-CM | POA: Diagnosis not present

## 2023-11-28 DIAGNOSIS — H5203 Hypermetropia, bilateral: Secondary | ICD-10-CM | POA: Diagnosis not present

## 2023-11-28 DIAGNOSIS — H524 Presbyopia: Secondary | ICD-10-CM | POA: Diagnosis not present

## 2023-11-28 DIAGNOSIS — N401 Enlarged prostate with lower urinary tract symptoms: Secondary | ICD-10-CM | POA: Diagnosis not present

## 2023-11-28 DIAGNOSIS — E119 Type 2 diabetes mellitus without complications: Secondary | ICD-10-CM | POA: Diagnosis not present

## 2023-11-28 DIAGNOSIS — H25813 Combined forms of age-related cataract, bilateral: Secondary | ICD-10-CM | POA: Diagnosis not present

## 2023-11-28 DIAGNOSIS — I10 Essential (primary) hypertension: Secondary | ICD-10-CM | POA: Diagnosis not present

## 2023-11-28 DIAGNOSIS — E782 Mixed hyperlipidemia: Secondary | ICD-10-CM | POA: Diagnosis not present

## 2023-11-28 DIAGNOSIS — E6609 Other obesity due to excess calories: Secondary | ICD-10-CM | POA: Diagnosis not present

## 2023-12-29 DIAGNOSIS — N401 Enlarged prostate with lower urinary tract symptoms: Secondary | ICD-10-CM | POA: Diagnosis not present

## 2023-12-29 DIAGNOSIS — I10 Essential (primary) hypertension: Secondary | ICD-10-CM | POA: Diagnosis not present

## 2023-12-29 DIAGNOSIS — E6609 Other obesity due to excess calories: Secondary | ICD-10-CM | POA: Diagnosis not present

## 2023-12-29 DIAGNOSIS — E782 Mixed hyperlipidemia: Secondary | ICD-10-CM | POA: Diagnosis not present

## 2024-01-28 DIAGNOSIS — I10 Essential (primary) hypertension: Secondary | ICD-10-CM | POA: Diagnosis not present

## 2024-01-28 DIAGNOSIS — E782 Mixed hyperlipidemia: Secondary | ICD-10-CM | POA: Diagnosis not present

## 2024-01-28 DIAGNOSIS — N401 Enlarged prostate with lower urinary tract symptoms: Secondary | ICD-10-CM | POA: Diagnosis not present

## 2024-01-28 DIAGNOSIS — E6609 Other obesity due to excess calories: Secondary | ICD-10-CM | POA: Diagnosis not present
